# Patient Record
Sex: Female | Born: 1950 | Race: White | Hispanic: No | Marital: Married | Smoking: Never smoker
Health system: Southern US, Community
[De-identification: ages and names within clinical notes are randomized; demographics above are authoritative.]

## PROBLEM LIST (undated history)

## (undated) DIAGNOSIS — E079 Disorder of thyroid, unspecified: Secondary | ICD-10-CM

## (undated) DIAGNOSIS — H269 Unspecified cataract: Secondary | ICD-10-CM

## (undated) DIAGNOSIS — I10 Essential (primary) hypertension: Secondary | ICD-10-CM

## (undated) HISTORY — DX: Essential (primary) hypertension: I10

## (undated) HISTORY — DX: Unspecified cataract: H26.9

## (undated) HISTORY — DX: Disorder of thyroid, unspecified: E07.9

## (undated) HISTORY — PX: TUBAL LIGATION: SHX77

## (undated) HISTORY — PX: FOOT SURGERY: SHX648

---

## 2014-06-12 ENCOUNTER — Encounter: Payer: Self-pay | Admitting: Podiatry

## 2014-06-12 ENCOUNTER — Ambulatory Visit: Payer: Self-pay

## 2014-06-12 ENCOUNTER — Ambulatory Visit (INDEPENDENT_AMBULATORY_CARE_PROVIDER_SITE_OTHER): Payer: 59 | Admitting: Podiatry

## 2014-06-12 VITALS — BP 128/78 | HR 83 | Resp 16

## 2014-06-12 DIAGNOSIS — M722 Plantar fascial fibromatosis: Secondary | ICD-10-CM

## 2014-06-12 DIAGNOSIS — M76812 Anterior tibial syndrome, left leg: Secondary | ICD-10-CM | POA: Diagnosis not present

## 2014-06-12 DIAGNOSIS — M79672 Pain in left foot: Secondary | ICD-10-CM

## 2014-06-12 NOTE — Progress Notes (Signed)
   Subjective:    Patient ID: Lori LahRachel Barile, female    DOB: 05/28/1950, 64 y.o.   MRN: [id number]  HPI Comments: "I have a pain on the side"  Patient c/o aching medial foot left for several months. She has AM pain. Tried different shoes-no help.  Foot Pain Associated symptoms include arthralgias.      Review of Systems  Musculoskeletal: Positive for arthralgias.  All other systems reviewed and are negative.      Objective:   Physical Exam: I have reviewed her past medical history medications allergies surgery social history and review of systems area pulses are strongly palpable bilateral. Neurologic sensorium is intact per Semmes-Weinstein monofilament. Deep tendon reflexes are intact bilateral and muscle strength +5 over 5 dorsiflexors plantar flexors and inverters everters all intrinsic musculature is intact. 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 There is an area of bogginess over the insertion site that appears to be fluctuant but slightly ganglion or a bursitis. This very well could be a split tear with fluid collection. This could be a bursitis or a ganglion.        Assessment & Plan:  Assessment: Insertional tibialis anterior tendinitis left.  Plan: Injected the area today with Kenalog and local anesthetic. Discussed appropriate shoe gear stretching her size and ice therapy will follow-up with her in 1 month.

## 2014-07-12 ENCOUNTER — Ambulatory Visit (INDEPENDENT_AMBULATORY_CARE_PROVIDER_SITE_OTHER): Payer: 59 | Admitting: Podiatry

## 2014-07-12 ENCOUNTER — Encounter: Payer: Self-pay | Admitting: Podiatry

## 2014-07-12 DIAGNOSIS — M76812 Anterior tibial syndrome, left leg: Secondary | ICD-10-CM | POA: Diagnosis not present

## 2014-07-12 NOTE — Progress Notes (Signed)
She presents today for follow-up of her tenosynovitis and tibialis anterior tendinitis. She states that she is a proximally 90% improved.  Objective: Vital signs are stable she is alert and oriented 3. Pulses are palpable left. She has mild fluctuance at the very distal aspect of the insertion site but much decreased than previously noted. No erythema cellulitis drainage or odor.  Assessment: Very mild residual insertional tibialis anterior tendinitis left.  Plan: Injected 2 mg of dexamethasone to the deep surface of the insertion site. Follow up with her in 1 month if necessary.

## 2014-08-09 ENCOUNTER — Ambulatory Visit: Payer: 59 | Admitting: Podiatry

## 2015-01-01 ENCOUNTER — Ambulatory Visit (INDEPENDENT_AMBULATORY_CARE_PROVIDER_SITE_OTHER): Payer: 59

## 2015-01-01 ENCOUNTER — Encounter: Payer: Self-pay | Admitting: Podiatry

## 2015-01-01 ENCOUNTER — Ambulatory Visit (INDEPENDENT_AMBULATORY_CARE_PROVIDER_SITE_OTHER): Payer: 59 | Admitting: Podiatry

## 2015-01-01 VITALS — BP 114/79 | HR 64 | Resp 16

## 2015-01-01 DIAGNOSIS — M7662 Achilles tendinitis, left leg: Secondary | ICD-10-CM

## 2015-01-01 DIAGNOSIS — M76812 Anterior tibial syndrome, left leg: Secondary | ICD-10-CM | POA: Diagnosis not present

## 2015-01-01 DIAGNOSIS — M79672 Pain in left foot: Secondary | ICD-10-CM

## 2015-01-01 MED ORDER — METHYLPREDNISOLONE 4 MG PO TBPK: ORAL_TABLET | ORAL | Status: DC

## 2015-01-01 NOTE — Progress Notes (Signed)
She presents today with chief complaint of pain to the anteromedial aspect of her left foot. She also has some posterior Achilles tendon pain. She states that after the injections and medications last visit I was doing very well up until a month ago. She denies any changes in her past medical history medications allergies or surgeries.  Objective: Vital signs are stable she is alert and oriented 3. Pulses are strongly palpable left foot. She has mild tenderness on inversion against resistance with inversion and dorsiflexion is particularly at the insertion site of the tibialis anterior muscle and tendon. She also has some bursitis on the posterior aspect of the calcaneus at the calcaneal Achilles insertion site. Radiographs confirmed no major osseous abnormalities in these areas.  Assessment: Insertional Achilles tendinitis with bursitis left. Tibialis anterior tendinitis at its insertion left.  Plan: Injected these areas today subcutaneously with 2 mg of dexamethasone did not inject into the tendons. Dispensed a cam walker for her left foot and wrote a prescription for Medrol Dosepak. I encouraged her to leave this Cam Walker on for the next 2-3 weeks and I will follow-up with her in 1 month. She will ice when she has the opportunity.

## 2015-01-01 NOTE — Patient Instructions (Signed)

## 2015-02-05 ENCOUNTER — Telehealth: Payer: Self-pay | Admitting: *Deleted

## 2015-02-05 ENCOUNTER — Encounter: Payer: Self-pay | Admitting: Podiatry

## 2015-02-05 ENCOUNTER — Ambulatory Visit (INDEPENDENT_AMBULATORY_CARE_PROVIDER_SITE_OTHER): Payer: 59 | Admitting: Podiatry

## 2015-02-05 VITALS — BP 116/51 | HR 82 | Resp 16

## 2015-02-05 DIAGNOSIS — M7662 Achilles tendinitis, left leg: Secondary | ICD-10-CM

## 2015-02-05 DIAGNOSIS — M76812 Anterior tibial syndrome, left leg: Secondary | ICD-10-CM | POA: Diagnosis not present

## 2015-02-05 NOTE — Progress Notes (Signed)
She presents today for follow-up of her tibialis anterior tendinitis at its insertion site left foot. As well as the Achilles tendinitis left foot. She states that it is still quite sore and really the injection only lasted approximately 1-2 weeks.  Objective: Pulses are strongly palpable left foot. She has tenderness on palpation of the insertion site of the tibialis anterior and the Achilles tendon left.  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  Plan: Request MRI for surgical consideration.

## 2015-02-05 NOTE — Telephone Encounter (Addendum)
Faxed to [facility].  [employer] prior approval notification# [id number].  Faxed to [facility].

## 2015-02-13 ENCOUNTER — Encounter: Payer: Self-pay | Admitting: *Deleted

## 2015-02-13 ENCOUNTER — Ambulatory Visit
Admission: RE | Admit: 2015-02-13 | Discharge: 2015-02-13 | Disposition: A | Discharge status: Home / Self Care | Payer: 59 | Source: Ambulatory Visit | Attending: Podiatry | Admitting: Podiatry

## 2015-02-13 DIAGNOSIS — M7662 Achilles tendinitis, left leg: Secondary | ICD-10-CM

## 2015-02-13 DIAGNOSIS — M76812 Anterior tibial syndrome, left leg: Secondary | ICD-10-CM

## 2015-02-14 ENCOUNTER — Telehealth: Payer: Self-pay | Admitting: *Deleted

## 2015-02-14 ENCOUNTER — Ambulatory Visit: Payer: 59 | Admitting: Podiatry

## 2015-02-14 DIAGNOSIS — M7662 Achilles tendinitis, left leg: Secondary | ICD-10-CM

## 2015-02-14 DIAGNOSIS — M76812 Anterior tibial syndrome, left leg: Secondary | ICD-10-CM

## 2015-02-14 NOTE — Telephone Encounter (Addendum)
-----   Message from Elinor Parkinson, [place] sent at 02/14/2015  7:40 AM EDT ----- Milton Ferguson call [place] and let her know that her achilles tendon is good and the tibialis anterior tendon is good per MRI.  I don't need to see her today.  I would like her to start PT near where she lives and try that for one month.  I then would like for you to call the imaging center where this mri was read and ask them to specifically look at the tibialis anterior insertion and the achilles at its insertion site.  There is something going on there.  I can palpate it.  Ask them to take a closer look and get back to [place].    There is no reason for her to drive from [facility] today for me to tell her to go to PT.  Thanks   Orders to pt, she states she will call me, with a PT in network and close.  Pt called states she has an appt on 02/20/2015 [facility] just needs our orders - phone [phone] and fax [phone].. I called [facility] - Selena Batten will fax me a form.

## 2015-02-14 NOTE — Telephone Encounter (Addendum)
-----   Message from Elinor Parkinson, [place] sent at 02/13/2015  5:02 PM EDT ----- Send for an overe read and ask them to look at the area of interest. Ordered MRI disc for SE OverRead, Tamela Oddi states she will have the disc burned by the tech from the MRI machine.  I informed pt of the delay for the in-depth reading and will call with results are in.

## 2015-02-14 NOTE — Telephone Encounter (Signed)
-----   Message from Elinor Parkinson, [place] sent at 02/14/2015  7:40 AM EDT ----- Milton Ferguson call [facility] and let her know that her achilles tendon is good and the tibialis anterior tendon is good per MRI.  I don't need to see her today.  I would like her to start PT near where she lives and try that for one month.  I then would like for you to call the imaging center where this mri was read and ask them to specifically look at the tibialis anterior insertion and the achilles at its insertion site.  There is something going on there.  I can palpate it.  Ask them to take a closer look and get back to [place].    There is no reason for her to drive from [facility] today for me to tell her to go to PT.  Thanks   Orders to pt, she states she will call me, with a PT in network and close.  Pt called states she has an appt on 02/20/2015 [facility] just needs our orders - phone [phone] and fax [phone].. I called [facility] - Selena Batten will fax me a form.  I faxed our EPIC form due to non-receipt of the [facility] form to [phone].

## 2015-04-04 ENCOUNTER — Encounter: Payer: Self-pay | Admitting: Podiatry

## 2015-04-04 ENCOUNTER — Ambulatory Visit (INDEPENDENT_AMBULATORY_CARE_PROVIDER_SITE_OTHER): Payer: 59 | Admitting: Podiatry

## 2015-04-04 VITALS — BP 118/72 | HR 89 | Resp 16

## 2015-04-04 DIAGNOSIS — M7662 Achilles tendinitis, left leg: Secondary | ICD-10-CM

## 2015-04-04 DIAGNOSIS — M76812 Anterior tibial syndrome, left leg: Secondary | ICD-10-CM

## 2015-04-04 MED ORDER — DICLOFENAC SODIUM 1 % TD GEL: 4.0000 g | Freq: Four times a day (QID) | TRANSDERMAL | Status: DC

## 2015-04-04 NOTE — Progress Notes (Signed)
She presents today for her MRI results regarding her left Achilles tendon and insertional tibialis anterior tendon pain. She states that is about the same really doesn't bother her all the time.  Objective: Vital signs are stable she is alert and oriented 3. Pulses are palpable. She still has tenderness on palpation of the tibialis anterior is insertion site with fluctuance as well as the Achilles tendon. MRI states that there are split tears within the posterior tibial tendon near its insertion site as well as the perineal tendon. It also states that she has some insertional fraying of the tibialis anterior tendon. It also relates Achilles tendinitis from the muscle belly distally.  Assessment: Achilles tendinitis and tibialis anterior tendinitis.  Plan: I wrote a prescription for diclofenac gel to be applied 4 times a day to these areas as well as placed her in a short cam boot and a night splint. I will follow up with her in 4-6 weeks and consider surgical intervention if needed.

## 2015-05-16 ENCOUNTER — Ambulatory Visit: Payer: 59 | Admitting: Podiatry

## 2015-05-16 ENCOUNTER — Ambulatory Visit (INDEPENDENT_AMBULATORY_CARE_PROVIDER_SITE_OTHER): Payer: 59

## 2015-05-16 ENCOUNTER — Encounter: Payer: Self-pay | Admitting: Podiatry

## 2015-05-16 ENCOUNTER — Ambulatory Visit (INDEPENDENT_AMBULATORY_CARE_PROVIDER_SITE_OTHER): Payer: 59 | Admitting: Podiatry

## 2015-05-16 VITALS — BP 100/68 | HR 74 | Resp 16

## 2015-05-16 DIAGNOSIS — M76812 Anterior tibial syndrome, left leg: Secondary | ICD-10-CM

## 2015-05-16 DIAGNOSIS — M7662 Achilles tendinitis, left leg: Secondary | ICD-10-CM

## 2015-05-16 DIAGNOSIS — M79672 Pain in left foot: Secondary | ICD-10-CM

## 2015-05-20 NOTE — Progress Notes (Signed)
She presents today for her 4 week follow-up regarding her Achilles tendinitis and tibialis anterior tendinitis of her left foot. She states that she continues to use of diclofenac gel but she still has pain first thing in the morning as she gets out of bed. She states that the pain subsides after she's been on the foot for a period of time. She's been utilizing the short Cam Walker for the past 4-6 weeks.  Objective: Vital signs are stable she is alert and oriented 3. Pulses are strongly palpable left foot. She has minimal discomfort on palpation of the tibialis anterior at its insertion site where an MRI does show a short segment tear. She also has some insertional pain on the posterior medial aspect of the Achilles tendon.  Assessment: Achilles tendinitis and tibialis anterior tendinitis left foot. Short segment tear of the tibialis anterior left foot.  Plan: Discussed etiology pathology conservative versus surgical therapies. At this point she would like to hold off on surgery as long as possible. She would like to consider being able to walk without the boot for a period of time just to reevaluate. I will follow-up with her in 4 weeks.

## 2015-06-13 ENCOUNTER — Ambulatory Visit: Payer: 59 | Admitting: Podiatry

## 2015-06-18 ENCOUNTER — Ambulatory Visit (INDEPENDENT_AMBULATORY_CARE_PROVIDER_SITE_OTHER): Payer: 59 | Admitting: Podiatry

## 2015-06-18 ENCOUNTER — Encounter: Payer: Self-pay | Admitting: Podiatry

## 2015-06-18 VITALS — BP 113/71 | HR 81 | Resp 16

## 2015-06-18 DIAGNOSIS — M7662 Achilles tendinitis, left leg: Secondary | ICD-10-CM | POA: Diagnosis not present

## 2015-06-18 DIAGNOSIS — M76812 Anterior tibial syndrome, left leg: Secondary | ICD-10-CM

## 2015-06-18 DIAGNOSIS — M7672 Peroneal tendinitis, left leg: Secondary | ICD-10-CM

## 2015-06-18 NOTE — Patient Instructions (Signed)
Pre-Operative Instructions  Congratulations, you have decided to take an important step to improving your quality of life.  You can be assured that the doctors of [facility] will be with you every step of the way.  1. Plan to be at the surgery center/hospital at least 1 (one) hour prior to your scheduled time unless otherwise directed by the surgical center/hospital staff.  You must have a responsible adult accompany you, remain during the surgery and drive you home.  Make sure you have directions to the surgical center/hospital and know how to get there on time. 2. For hospital based surgery you will need to obtain a history and physical form from your family physician within 1 month prior to the date of surgery- we will give you a form for you primary physician.  3. We make every effort to accommodate the date you request for surgery.  There are however, times where surgery dates or times have to be moved.  We will contact you as soon as possible if a change in schedule is required.   4. No Aspirin/Ibuprofen for one week before surgery.  If you are on aspirin, any non-steroidal anti-inflammatory medications (Mobic, Aleve, Ibuprofen) you should stop taking it 7 days prior to your surgery.  You make take Tylenol  For pain prior to surgery.  5. Medications- If you are taking daily heart and blood pressure medications, seizure, reflux, allergy, asthma, anxiety, pain or diabetes medications, make sure the surgery center/hospital is aware before the day of surgery so they may notify you which medications to take or avoid the day of surgery. 6. No food or drink after midnight the night before surgery unless directed otherwise by surgical center/hospital staff. 7. No alcoholic beverages 24 hours prior to surgery.  No smoking 24 hours prior to or 24 hours after surgery. 8. Wear loose pants or shorts- loose enough to fit over bandages, boots, and casts. 9. No slip on shoes, sneakers are best. 10. Bring  your boot with you to the surgery center/hospital.  Also bring crutches or a walker if your physician has prescribed it for you.  If you do not have this equipment, it will be provided for you after surgery. 11. If you have not been contracted by the surgery center/hospital by the day before your surgery, call to confirm the date and time of your surgery. 12. Leave-time from work may vary depending on the type of surgery you have.  Appropriate arrangements should be made prior to surgery with your employer. 13. Prescriptions will be provided immediately following surgery by your doctor.  Have these filled as soon as possible after surgery and take the medication as directed. 14. Remove nail polish on the operative foot. 15. Wash the night before surgery.  The night before surgery wash the foot and leg well with the antibacterial soap provided and water paying special attention to beneath the toenails and in between the toes.  Rinse thoroughly with water and dry well with a towel.  Perform this wash unless told not to do so by your physician.  Enclosed: 1 Ice pack (please put in freezer the night before surgery)   1 Hibiclens skin cleaner   Pre-op Instructions  If you have any questions regarding the instructions, do not hesitate to call our office.  [place]: [address] [place], [place] [address] [phone]  [place]: [address] [place], [place] [address] [phone]  [place]: [address]  [place], [place] [address] [phone]  Dr. Richard   Tuchman DPM, Dr. Norman Regal DPM Dr. Richard Sikora DPM, Dr. M. Todd Hyatt DPM, Dr. Kathryn Egerton DPM 

## 2015-06-18 NOTE — Progress Notes (Signed)
She presents today with her husband for surgical consult regarding her left foot. She states that her left foot is still painful though it is better than when we started is still significantly intolerable for her daily activities. She is requesting surgical intervention at this time. She denies changes in her past medical history medications allergies surgery social history. She has tried and failed all conservative therapies including physical therapy injection therapy steroids nonsteroidals and immobilization for several months.  Objective: Vital signs are stable alert and oriented 3. Pulses are strongly palpable left. No significant changes on physical examination. Still has considerable pain at the tibialis anterior insertion site. Significant pain on palpation of the Achilles tendon with significant gastroc equinus left foot and ankle. 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 He also confirms a split tear of the peroneal brevis tendon.  Assessment: Insertional Achilles tendinitis insertional tibialis anterior tendinitis, split tear of the peroneal brevis tendon. Gastroc equinus.  Plan: Discussed etiology pathology conservative versus surgical therapies. We went over a consent form today line by line number by number given her ample time to ask questions she saw fit regarding a gastroc recession, and Achilles tendon lysis, retrocalcaneal heel spur resection,. Peroneal tendon repair, repair tibialis anterior tendon insertion on the left foot with cast application. I answered all the questions regarding these procedures to the best of my ability in layman's terms. We discussed the possible postoperative palpitations which may include but are not limited to postop pain bleeding swelling infection recurrence need for further surgery and for physical therapy. Loss of digit loss of limb loss of life. I will follow-up with her in March for surgery.

## 2015-06-19 ENCOUNTER — Telehealth: Payer: Self-pay | Admitting: *Deleted

## 2015-06-19 NOTE — Telephone Encounter (Signed)
"  Please give me a call back.  I was told I needed to get a history and physical form from my primary care physician.  You didn't give me the form in the packet that you gave me.  I'm getting ready to schedule that appointment with my doctor.  Let me know how I can get that form.  Thank you."  I'm returning your call.  Did Dr. Al Corpus tell you to have a physical?  "No, he did not tell me to have a physical but that's what was in my paperwork that you gave me."  You do not have to have a physical.  That is only if you have surgery at the hospital.  You're not having surgery at the hospital.  You're having surgery at [facility].  It is not required there.  "Oh okay, I was scheduled to go anyway so I was going to reschedule it if I needed to wait on the form to get to me.  Thank you so much."

## 2015-07-11 ENCOUNTER — Telehealth: Payer: Self-pay | Admitting: *Deleted

## 2015-07-11 NOTE — Telephone Encounter (Signed)
I faxed authorization for surgery scheduled for 07/26/2015 to [place] [facility].  Authorization number is [id number].

## 2015-07-25 ENCOUNTER — Other Ambulatory Visit: Payer: Self-pay | Admitting: Podiatry

## 2015-07-25 MED ORDER — ONDANSETRON HCL 4 MG PO TABS: 4.0000 mg | ORAL_TABLET | Freq: Three times a day (TID) | ORAL | Status: AC | PRN

## 2015-07-25 MED ORDER — HYDROMORPHONE HCL 4 MG PO TABS: ORAL_TABLET | ORAL | Status: AC

## 2015-07-25 MED ORDER — CEPHALEXIN 500 MG PO CAPS: 500.0000 mg | ORAL_CAPSULE | Freq: Three times a day (TID) | ORAL | Status: DC

## 2015-07-26 DIAGNOSIS — M216X2 Other acquired deformities of left foot: Secondary | ICD-10-CM | POA: Diagnosis not present

## 2015-07-26 DIAGNOSIS — M2042 Other hammer toe(s) (acquired), left foot: Secondary | ICD-10-CM

## 2015-07-26 DIAGNOSIS — M76812 Anterior tibial syndrome, left leg: Secondary | ICD-10-CM | POA: Diagnosis not present

## 2015-07-26 DIAGNOSIS — M7732 Calcaneal spur, left foot: Secondary | ICD-10-CM

## 2015-07-27 ENCOUNTER — Telehealth: Payer: Self-pay

## 2015-07-27 NOTE — Telephone Encounter (Signed)
Spoke with pt husband, pt was resting at the time. He stated she was feeling well, that the block had worn off at 3am this morning and she had been taking prn medication as scheduled. He stated she has been resting comfortably, and managing pain well. Denied fever, chill, nausea. She was icing and elvating her foot and resting. Advised to call if any questions come up or if any acute symptoms occur.

## 2015-08-01 ENCOUNTER — Ambulatory Visit (INDEPENDENT_AMBULATORY_CARE_PROVIDER_SITE_OTHER): Payer: 59

## 2015-08-01 ENCOUNTER — Encounter: Payer: Self-pay | Admitting: Podiatry

## 2015-08-01 ENCOUNTER — Ambulatory Visit (INDEPENDENT_AMBULATORY_CARE_PROVIDER_SITE_OTHER): Payer: 59 | Admitting: Podiatry

## 2015-08-01 VITALS — BP 99/59 | HR 105 | Temp 98.1°F | Resp 16

## 2015-08-01 DIAGNOSIS — Z9889 Other specified postprocedural states: Secondary | ICD-10-CM | POA: Diagnosis not present

## 2015-08-01 DIAGNOSIS — M7672 Peroneal tendinitis, left leg: Secondary | ICD-10-CM | POA: Diagnosis not present

## 2015-08-01 DIAGNOSIS — M76812 Anterior tibial syndrome, left leg: Secondary | ICD-10-CM | POA: Diagnosis not present

## 2015-08-01 DIAGNOSIS — M7662 Achilles tendinitis, left leg: Secondary | ICD-10-CM

## 2015-08-04 NOTE — Progress Notes (Signed)
She presents today 1 week status post struck recession, Achilles tendon lysis with resection of retrocalcaneal heel spur. Repair of her tibialis anterior tendon. She denies fever chills nausea vomiting muscle aches and pains. No shortness of breath or chest pain. No calf pain. She states that really she has not had a whole lot of pain at all.  Objective: Vital signs are stable alert and oriented 3. Pulses are palpable. She has great range of motion of her toes the cast is not tight with vital signs stable on the leave the cast on for 1 more week.  Assessment: Well-healing surgical foot and leg left.  Plan: Continue nonweightbearing status left foot we will follow up with her in 1 more week at which time the cast will be removed and replaced.

## 2015-08-08 ENCOUNTER — Encounter: Payer: Self-pay | Admitting: Podiatry

## 2015-08-08 ENCOUNTER — Ambulatory Visit (INDEPENDENT_AMBULATORY_CARE_PROVIDER_SITE_OTHER): Payer: 59 | Admitting: Podiatry

## 2015-08-08 VITALS — BP 113/70 | HR 86 | Resp 12

## 2015-08-08 DIAGNOSIS — M76812 Anterior tibial syndrome, left leg: Secondary | ICD-10-CM

## 2015-08-08 DIAGNOSIS — M7672 Peroneal tendinitis, left leg: Secondary | ICD-10-CM

## 2015-08-08 DIAGNOSIS — M7662 Achilles tendinitis, left leg: Secondary | ICD-10-CM

## 2015-08-08 DIAGNOSIS — Z9889 Other specified postprocedural states: Secondary | ICD-10-CM

## 2015-08-08 NOTE — Progress Notes (Signed)
She presents today 2 weeks status post gastroc recession Achilles tendon or lysis retrocalcaneal heel spur resection evaluation of the peroneal tendons and repair of the tibialis anterior tendon. She denies fever chills nausea vomiting muscle aches and pains she denies any calf pain left. She denies any chest pain or shortness of breath.  Objective: Vital signs are stable alert and oriented 3. Once the cast was removed and a dry sterile dressing was removed he did demonstrate some dry blood to the posterior aspect of the incision site. This seems to be doing pretty well for her at this time sutures were intact as well as were staples I did remove the majority of the staples to the gastroc recession as well as some of them to the posterior aspect of the heel and I removed them overlying the peroneal tendon. All of her incision sites. The healing very nicely SE no signs of infection. At this point I redressed her today with Betadine dressing dry sterile compressive dressing as well as application of a new below knee cast. I will follow-up with her in 2 weeks for cast removal she is to remain nonweightbearing and we will remove the remainder of the stitches and staples.

## 2015-08-22 ENCOUNTER — Ambulatory Visit (INDEPENDENT_AMBULATORY_CARE_PROVIDER_SITE_OTHER): Payer: 59

## 2015-08-22 ENCOUNTER — Ambulatory Visit (INDEPENDENT_AMBULATORY_CARE_PROVIDER_SITE_OTHER): Payer: 59 | Admitting: Podiatry

## 2015-08-22 ENCOUNTER — Encounter: Payer: Self-pay | Admitting: Podiatry

## 2015-08-22 VITALS — BP 108/64 | HR 92 | Resp 16

## 2015-08-22 DIAGNOSIS — M76812 Anterior tibial syndrome, left leg: Secondary | ICD-10-CM

## 2015-08-22 DIAGNOSIS — Z9889 Other specified postprocedural states: Secondary | ICD-10-CM | POA: Diagnosis not present

## 2015-08-22 DIAGNOSIS — M7672 Peroneal tendinitis, left leg: Secondary | ICD-10-CM | POA: Diagnosis not present

## 2015-08-22 DIAGNOSIS — M7662 Achilles tendinitis, left leg: Secondary | ICD-10-CM

## 2015-08-24 NOTE — Progress Notes (Signed)
She presents today for follow-up of her surgical heel left, gastroc recession left peroneal repair left and tibialis anterior tendon repair left. She states that she is doing very well as she presents in her cast today.  Objective: Vital signs are stable alert and oriented 3 cast intact once removed demonstrates all the remaining staples were intact margins are well coapted we removed the staples today. No erythema or edema saline as drainage or odor. She has great range of motion without any pain. No calf pain. The leg is not swollen pulses remain palpable.  Assessment: Well-healing surgical foot left.  Plan: Discussed etiology pathology conservative versus surgical therapies. Follow up with her in 3 weeks.

## 2015-09-17 ENCOUNTER — Encounter: Payer: 59 | Admitting: Podiatry

## 2015-09-19 ENCOUNTER — Ambulatory Visit (INDEPENDENT_AMBULATORY_CARE_PROVIDER_SITE_OTHER): Payer: 59

## 2015-09-19 ENCOUNTER — Ambulatory Visit (INDEPENDENT_AMBULATORY_CARE_PROVIDER_SITE_OTHER): Payer: 59 | Admitting: Podiatry

## 2015-09-19 ENCOUNTER — Encounter: Payer: Self-pay | Admitting: Podiatry

## 2015-09-19 VITALS — BP 118/68 | HR 92 | Resp 12

## 2015-09-19 DIAGNOSIS — M7672 Peroneal tendinitis, left leg: Secondary | ICD-10-CM

## 2015-09-19 DIAGNOSIS — M7662 Achilles tendinitis, left leg: Secondary | ICD-10-CM | POA: Diagnosis not present

## 2015-09-19 DIAGNOSIS — M76812 Anterior tibial syndrome, left leg: Secondary | ICD-10-CM

## 2015-09-19 DIAGNOSIS — Z9889 Other specified postprocedural states: Secondary | ICD-10-CM

## 2015-09-19 NOTE — Progress Notes (Signed)
She presents today for follow-up of her left foot surgery status post gastroc recession, retrocalcaneal heel spur resection, Achilles tendon repair, peroneal tendon evaluation and a tibialis anterior tendon repair date of surgery 07/26/2015. She states that she is doing great very little pain and she is ready to get out of her Cam Walker.  Objective: Vital signs are stable she is alert and oriented 3. There is no erythematous cellulitis drainage or odor no signs of infection. She has completely healed wounds no open lesions. She has full strength of her tibialis anterior and her Achilles. Peroneal tendon appears to be intact good full abduction against resistance without pain.  Assessment: Well-healing surgical foot and leg left.  Plan: I encouraged her to get into a good supportive shoe and to continue to wear the Cam Walker as needed. I will follow-up with her in about 6 weeks.

## 2015-10-31 ENCOUNTER — Encounter: Payer: Self-pay | Admitting: Podiatry

## 2015-10-31 ENCOUNTER — Ambulatory Visit (INDEPENDENT_AMBULATORY_CARE_PROVIDER_SITE_OTHER): Payer: 59 | Admitting: Podiatry

## 2015-10-31 ENCOUNTER — Ambulatory Visit (INDEPENDENT_AMBULATORY_CARE_PROVIDER_SITE_OTHER): Payer: 59

## 2015-10-31 DIAGNOSIS — M7672 Peroneal tendinitis, left leg: Secondary | ICD-10-CM

## 2015-10-31 DIAGNOSIS — M76812 Anterior tibial syndrome, left leg: Secondary | ICD-10-CM | POA: Diagnosis not present

## 2015-10-31 DIAGNOSIS — B070 Plantar wart: Secondary | ICD-10-CM | POA: Diagnosis not present

## 2015-10-31 DIAGNOSIS — B079 Viral wart, unspecified: Secondary | ICD-10-CM

## 2015-10-31 DIAGNOSIS — M7662 Achilles tendinitis, left leg: Secondary | ICD-10-CM

## 2015-10-31 DIAGNOSIS — Z9889 Other specified postprocedural states: Secondary | ICD-10-CM

## 2015-10-31 NOTE — Progress Notes (Signed)
She presents today for follow-up of her surgery to her Achilles tendon or gastroc recession for tibialis anterior date of surgery 07/26/2015. She also states that she is having heel pain. She states that her surgery has not resolved all of the pain in the front or in the posterior aspect of the foot she states that she was hoping that it would be much better by now.  Objective: Vital signs are stable she is alert and oriented 3. Pulses are palpable. She has minimal tenderness on palpation of the surgical sites. She has good strong plantar flexion with no pain on palpation of the Achilles. She does have 2 small verrucoid lesions to the plantar medial aspect of the left heel.  Assessment: Well-healing surgical foot. Verruca plantaris left foot 2.  Plan: Chemical destruction of the lesions today and I will follow-up with her in 6 weeks.

## 2015-12-12 ENCOUNTER — Encounter: Payer: Self-pay | Admitting: Podiatry

## 2015-12-12 ENCOUNTER — Ambulatory Visit (INDEPENDENT_AMBULATORY_CARE_PROVIDER_SITE_OTHER): Payer: 59 | Admitting: Podiatry

## 2015-12-12 DIAGNOSIS — B079 Viral wart, unspecified: Secondary | ICD-10-CM | POA: Diagnosis not present

## 2015-12-12 DIAGNOSIS — M7662 Achilles tendinitis, left leg: Secondary | ICD-10-CM

## 2015-12-12 MED ORDER — DICLOFENAC SODIUM 1 % TD GEL: 4.0000 g | Freq: Four times a day (QID) | TRANSDERMAL | Status: AC

## 2015-12-12 MED ORDER — FLUOROURACIL 5 % EX CREA: TOPICAL_CREAM | Freq: Two times a day (BID) | CUTANEOUS | Status: AC

## 2015-12-14 NOTE — Progress Notes (Signed)
She presents today for follow-up of her Achilles tendon repair her tibialis anterior tendon repair in her lateral peroneal repair she is also concerned about warts to the plantar aspect of her left foot that she does not want to have anything done to them today.  Objective: Vital signs are stable she is alert and oriented 3 Achilles surgery is healing but is still tender on palpation. She has full strength range of motion on dorsiflexion and plantarflexion and eversion. Cutaneous evaluation does demonstrate a growing wart to the plantar medial aspect of the left heel with satellite lesions.  Assessment: Verruca plantaris left foot. Slowly healing Achilles.  Plan: I recommended chemical destruction today but she declined I wrote a prescription for exudate cream to be applied. This should be applied twice daily under occlusion. I will follow-up with her in 6 weeks for reevaluation may need to consider laser therapy.

## 2016-01-17 ENCOUNTER — Encounter: Payer: Self-pay | Admitting: *Deleted

## 2016-01-17 NOTE — Progress Notes (Signed)
   DOS 07-26-15  Peroneus tendon repair left, achilles tenolysis left, gastroc ression, tibialis anterior repair,heel spur resection left

## 2016-01-23 ENCOUNTER — Ambulatory Visit (INDEPENDENT_AMBULATORY_CARE_PROVIDER_SITE_OTHER): Payer: 59 | Admitting: Podiatry

## 2016-01-23 ENCOUNTER — Encounter: Payer: Self-pay | Admitting: Podiatry

## 2016-01-23 DIAGNOSIS — B079 Viral wart, unspecified: Secondary | ICD-10-CM | POA: Diagnosis not present

## 2016-01-23 DIAGNOSIS — M76812 Anterior tibial syndrome, left leg: Secondary | ICD-10-CM

## 2016-01-23 NOTE — Progress Notes (Signed)
She presents today with a chief complaint of pain to the tibialis anterior tendon of the right foot. And a follow-up of a wart to the plantar aspect of the left heel. She states that the right foot just started hurting a little more than a week ago.  Objective: Vital signs are stable she's alert and oriented 3. Pulses are palpable. No change in the warty lesion plantar aspect of the left heel. I debrided to bleeding today. I also was able to palpate fluid within the tibialis anterior tendon sheath.  Assessment: Tibialis anterior tendinitis and wart plantar aspect left.  Plan: Chemical destruction of lesion. I injected the insertion point of the tibialis anterior tendon to the bone muscle tenderness with dexamethasone and local anesthetic. I will follow-up with her in November.

## 2016-04-23 ENCOUNTER — Encounter: Payer: Self-pay | Admitting: Podiatry

## 2016-04-23 ENCOUNTER — Ambulatory Visit (INDEPENDENT_AMBULATORY_CARE_PROVIDER_SITE_OTHER): Payer: Medicare PPO | Admitting: Podiatry

## 2016-04-23 DIAGNOSIS — B070 Plantar wart: Secondary | ICD-10-CM

## 2016-04-24 NOTE — Progress Notes (Signed)
She presents today for follow-up of plantar wart to the left heel. She states that she was using the medication which I have prescribed but it only seemed to make the work med and it started to grow. Her primary care provider had recommended that she place salicylic acid on the lesion and cover with duct tape.  Objective: Duct tape was removed demonstrates a quarter-sized lesion plantar medial heel with thrombosed capillaries.  Assessment: Verruca plantaris left.  Plan: Continue current therapies. Follow up with me as needed.

## 2016-07-26 IMAGING — MR MR FOOT*L* W/O CM
3 of 5 series · 9 of 40 positions shown · non-contrast
Comparison: None.

CLINICAL DATA: Achilles tendinitis, left medial pain

EXAM:
MRI OF THE LEFT FOREFOOT WITHOUT CONTRAST
TECHNIQUE: Multiplanar, multisequence MR imaging of the ankle was performed. No
intravenous contrast was administered.

[Series 3: PD fat-sat · axial · 3.0mm · 0.20mm/px · z∈[-67,+6]mm · 3 of 27 slices shown]
[im 4/27]
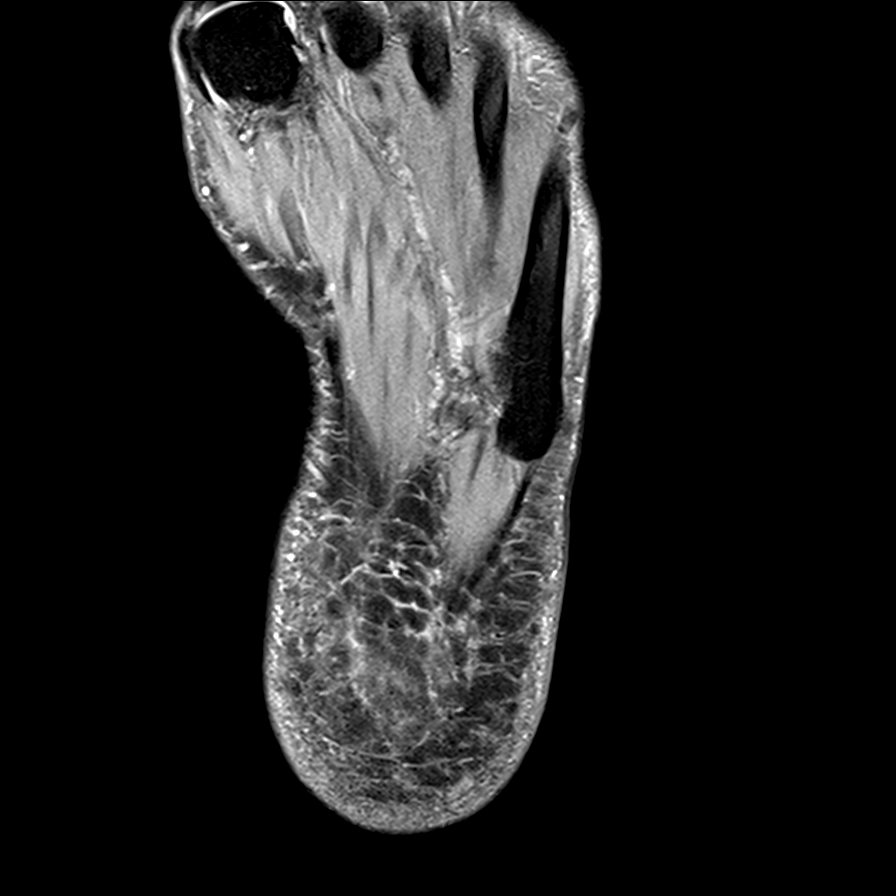
[im 15/27]
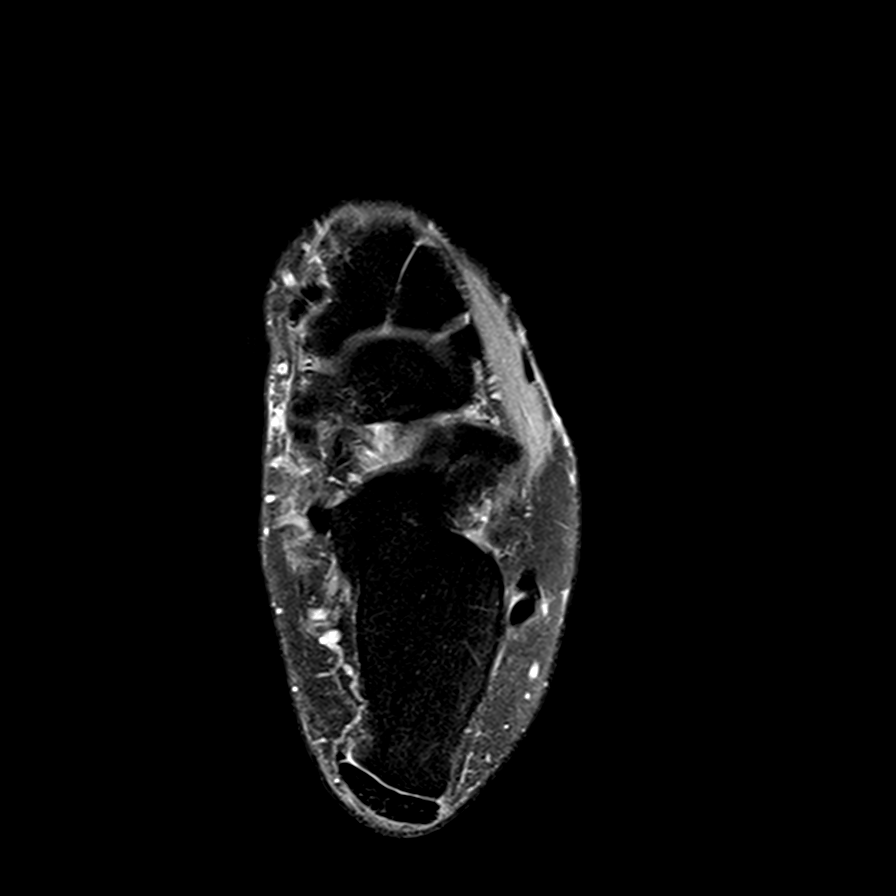
[im 23/27]
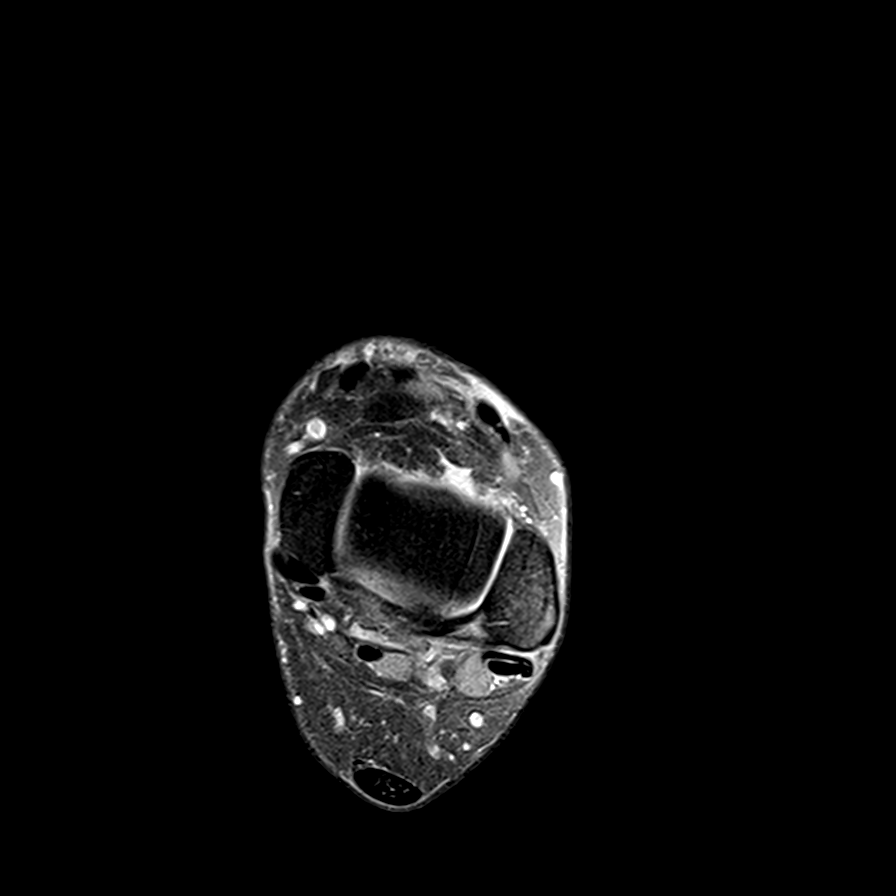

[Series 4: T2 fat-sat · axial · 3.0mm · 0.20mm/px · z∈[-67,+6]mm · 3 of 27 slices shown (1 of 2)]
[im 4/27]
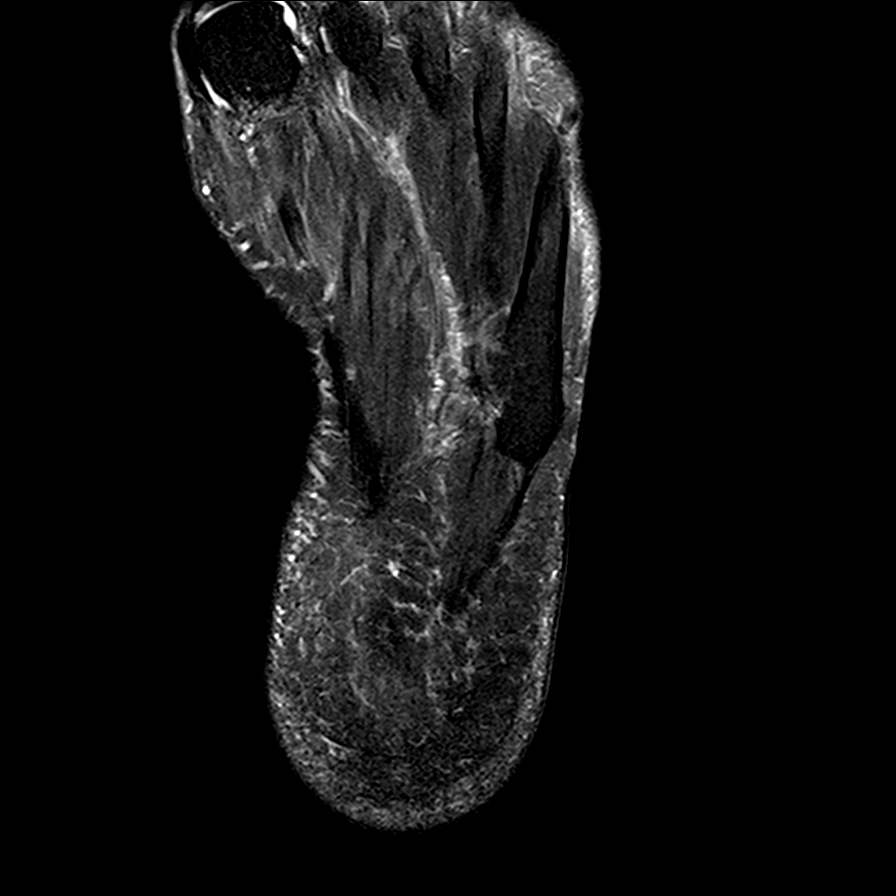
[im 15/27]
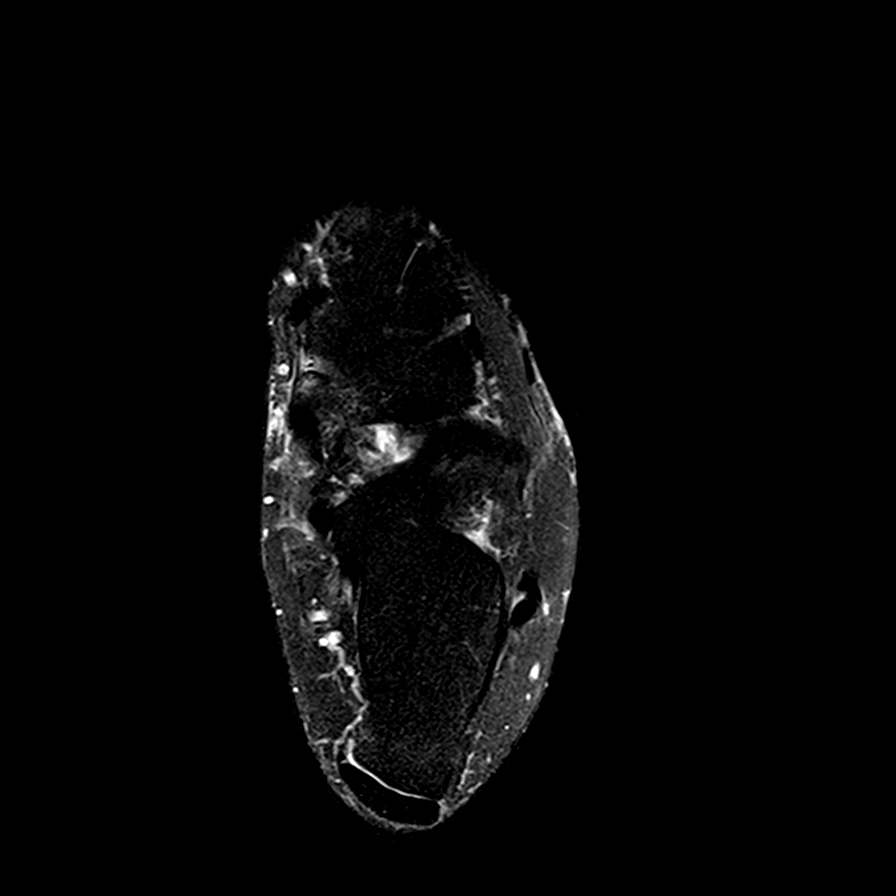
[im 23/27]
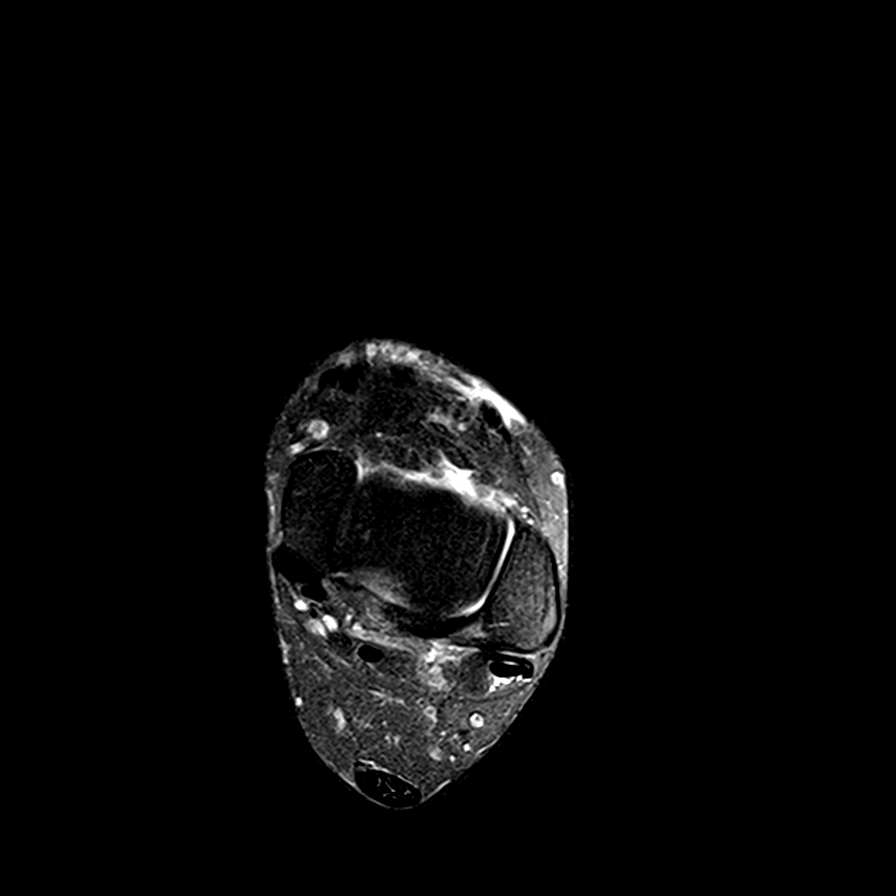

[Series 6: T2 fat-sat · coronal · 3.0mm · 0.33mm/px · 3 of 35 slices shown (2 of 2)]
[im 4/35]
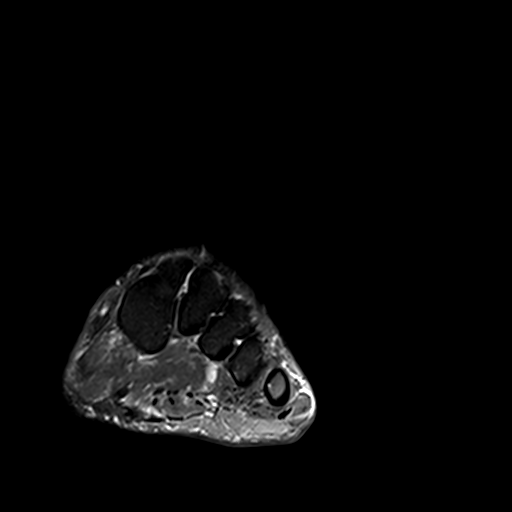
[im 19/35]
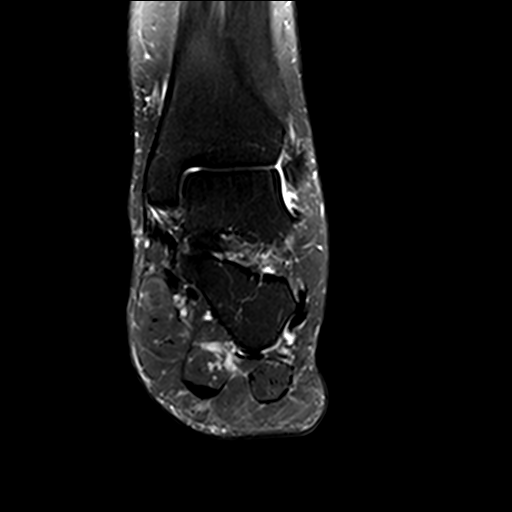
[im 31/35]
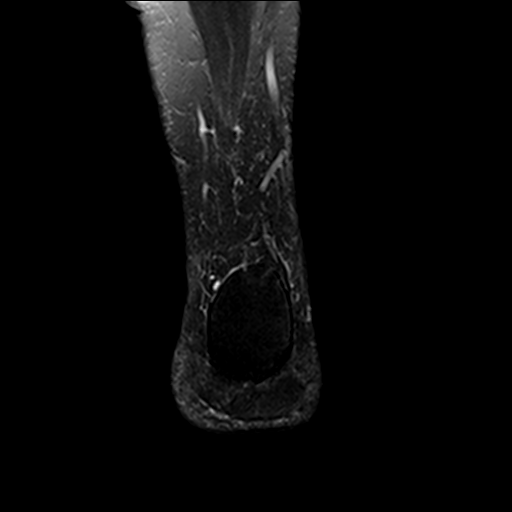

[9 of 40 positions shown; findings below may reference images not displayed]

FINDINGS: TENDONS

Peroneal: Intact peroneus longus. Short-segment longitudinal split
tear of the peroneus brevis at the level of the lateral malleolus.

Posteromedial: Mild tendinosis of the tibialis posterior tendon with
a small partial tear just distal to the medial malleolus. Intact
flexor hallucis longus and flexor digitorum longus tendons.

Anterior: Intact.

Achilles: Intact.

Plantar Fascia: Intact.

LIGAMENTS

Lateral: Intact.

Medial: Intact.

CARTILAGE

Ankle Joint: Normal ankle mortise. No joint effusion. No chondral
defect.

Subtalar Joints/Sinus Tarsi: Normal subtalar joints. Normal sinus
tarsi.

Bones: No marrow signal abnormality.  No fracture or dislocation.
IMPRESSION: 1. Mild tendinosis of the tibialis posterior tendon with a small
partial tear just distal to the medial malleolus.
2. Short-segment longitudinal split tear of the peroneus brevis at
the level of the lateral malleolus.

## 2017-09-30 NOTE — Progress Notes (Addendum)
[facility] Note  10/01/2017     CHIEF COMPLAINT Patient presents for Retina Evaluation   HISTORY OF PRESENT ILLNESS: Lori Schaefer is a 67 y.o. female who presents to the clinic today for:   HPI    Retina Evaluation    In both eyes.  This started 1 month ago.  Associated Symptoms Floaters.  Negative for Blind Spot, Glare, Shoulder/Hip pain, Fatigue, Jaw Claudication, Photophobia, Distortion, Redness, Scalp Tenderness, Weight Loss, Fever, Trauma, Pain and Flashes.  Context:  distance vision, mid-range vision and near vision.  Treatments tried include no treatments.  I, the attending physician,  performed the HPI with the patient and updated documentation appropriately.          Comments    Referral of DR. Hecker for retina evaluation. Patient states years ago she saw a ophthalmologist, she was told she had something in the back of her right eye not sure what was seen. Pt reports she is to have cataract sx OU  with Dr. Herbert Deaner soon. Pt states she has occasional floaters OD, denies wavy vision and ocular pain. Denies eye gtt's. She is taking multivitamins qd.       Last edited by [name], MD on 10/01/2017 10:56 AM. (History)    Pt states she had a self healing RD OD; Pt states she went to Dr. Herbert Deaner for cataract evaluation on the referral of Dr. Radford Pax (her regular eye doctor); Pt states she has blurred VA due to cataracts OU;   Referring physician: [name], MD [place], [place] [address]  HISTORICAL INFORMATION:   Selected notes from the MEDICAL RECORD NUMBER Referred by Dr. [name] for surgical clearance OD LEE: 05.02.19 (K. Hecker) [BCVA: OD: 20/30-2 OS: 20/25-1] Ocular Hx-Cataract OU, HTN Ret, Hx of RD (self-repaired), ERM OD, Salzmann's nodular dystrophy OD, drusen OS PMH-HTN,     CURRENT MEDICATIONS: No current outpatient medications on file. (Ophthalmic Drugs)   No current facility-administered medications  for this visit.  (Ophthalmic Drugs)   Current Outpatient Medications (Other)  Medication Sig  . calcium carbonate (OSCAL) 1500 (600 Ca) MG TABS tablet Take by mouth 2 (two) times daily with a meal.  . CHLORTHALIDONE PO Take by mouth.  . diclofenac sodium (VOLTAREN) 1 % GEL Apply 4 g topically 4 (four) times daily.  . fluorouracil (EFUDEX) 5 % cream Apply topically 2 (two) times daily.  Marland Kitchen levothyroxine (SYNTHROID, LEVOTHROID) 75 MCG tablet Take 75 mcg by mouth daily before breakfast.  . Multiple Vitamin (MULTIVITAMIN) capsule Take 1 capsule by mouth daily.  Marland Kitchen telmisartan (MICARDIS) 40 MG tablet Take 40 mg by mouth daily.  Marland Kitchen HYDROmorphone (DILAUDID) 4 MG tablet Take one tablet by mouth every six to eight hours as needed for pain. (Patient not taking: Reported on 10/01/2017)  . irbesartan (AVAPRO) 300 MG tablet Take 300 mg by mouth daily.  . ondansetron (ZOFRAN) 4 MG tablet Take 1 tablet (4 mg total) by mouth every 8 (eight) hours as needed for nausea or vomiting. (Patient not taking: Reported on 10/01/2017)   No current facility-administered medications for this visit.  (Other)      REVIEW OF SYSTEMS: ROS    Positive for: Eyes   Negative for: Constitutional, Gastrointestinal, Neurological, Skin, Genitourinary, Musculoskeletal, HENT, Endocrine, Cardiovascular, Respiratory, Psychiatric, Allergic/Imm, Heme/Lymph   Last edited by Zenovia Jordan, LPN on 09/16/9561  8:75 AM. (History)       ALLERGIES No Known Allergies  PAST MEDICAL HISTORY Past  Medical History:  Diagnosis Date  . Cataract   . Hypertension   . Thyroid disease    Past Surgical History:  Procedure Laterality Date  . FOOT SURGERY    . TUBAL LIGATION      FAMILY HISTORY Family History  Problem Relation Age of Onset  . Heart failure Mother     SOCIAL HISTORY Social History   Tobacco Use  . Smoking status: Never Smoker  . Smokeless tobacco: Never Used  Substance Use Topics  . Alcohol use: Yes     Alcohol/week: 0.6 oz    Types: 1 Glasses of wine per week  . Drug use: Never         OPHTHALMIC EXAM:  Base Eye Exam    Visual Acuity (Snellen - Linear)      Right Left   Dist cc 20/30 20/25   Dist ph cc NI NI   Correction:  Glasses       Tonometry (Tonopen, 9:32 AM)      Right Left   Pressure 13 14       Pupils      Dark Light Shape React APD   Right 4 2 Round Brisk None   Left 4 2 Round Brisk None       Visual Fields (Counting fingers)      Left Right    Full Full       Extraocular Movement      Right Left    Full, Ortho Full, Ortho       Neuro/Psych    Oriented x3:  Yes   Mood/Affect:  Normal       Dilation    Both eyes:  1.0% Mydriacyl, 2.5% Phenylephrine @ 9:32 AM        Slit Lamp and Fundus Exam    Slit Lamp Exam      Right Left   Lids/Lashes Dermatochalasis - upper lid Dermatochalasis - upper lid   Conjunctiva/Sclera White and quiet White and quiet   Cornea Arcus, 2+ central Punctate epithelial erosions, salzmann nodualt superiorly Arcus, 2+ central Punctate epithelial erosions, salzmann nodualt superiorly   Anterior Chamber Deep and quiet Deep and quiet   Iris Round and dilated, PPM Round and dilated   Lens 2+ Nuclear sclerosis, 2+ Cortical cataract, 1+ Posterior subcapsular cataract 2+ Nuclear sclerosis, 2+ Cortical cataract, 1+ Posterior subcapsular cataract   Vitreous Vitreous syneresis Vitreous syneresis       Fundus Exam      Right Left   Disc Pink and Sharp Mild tilt, mild Peripapillary atrophy   C/D Ratio 0.3 0.3   Macula Atrophy and pigment clumping inferior and temporal macula, Flat, Retinal pigment epithelial mottling, No heme or edema Flat, Retinal pigment epithelial mottling, No heme or edema   Vessels Vascular attenuation -- greatest inferiorly, mildly Tortuous Mild Vascular attenuation   Periphery Attached, extensive atrophy and pigment clumping from 0400 to 0130 consistent with history of old RD - sparing the fovea Attached         Refraction    Manifest Refraction      Sphere Cylinder Axis Dist VA   Right -3.75 +2.00 005    Left -5.00 +1.75 175        Manifest Refraction #2      Sphere Cylinder Axis Dist VA   Right -3.75 +2.00 015 20/30+2   Left -5.50 +2.00 175 20/25          IMAGING AND PROCEDURES  Imaging and Procedures for @TODAY @  OCT, Retina - OU - Both Eyes       Right Eye Quality was good. Central Foveal Thickness: 355. Progression has no prior data. Findings include normal foveal contour, no IRF, no SRF, outer retinal atrophy, inner retinal atrophy, epiretinal membrane (Mild ERM, inner and outer retinal atrophy inferiorly).   Left Eye Quality was good. Central Foveal Thickness: 302. Progression has no prior data. Findings include normal foveal contour, no IRF, no SRF (Trace ERM).   Notes *Images captured and stored on drive  Diagnosis / Impression:  OD: inferior inner and outer retinal atrophy consistent with Hx of inferior RD, mild ERM OS: NFP, No IRF/SRF, trace ERM  Clinical management:  See below  Abbreviations: NFP - Normal foveal profile. CME - cystoid macular edema. PED - pigment epithelial detachment. IRF - intraretinal fluid. SRF - subretinal fluid. EZ - ellipsoid zone. ERM - epiretinal membrane. ORA - outer retinal atrophy. ORT - outer retinal tubulation. SRHM - subretinal hyper-reflective material                  ASSESSMENT/PLAN:    ICD-10-CM   1. History of retinal detachment Z86.69   2. Epiretinal membrane (ERM) of left eye H35.372   3. Retinal edema H35.81 OCT, Retina - OU - Both Eyes  4. Essential hypertension I10   5. Hypertensive retinopathy of both eyes H35.033   6. Combined forms of age-related cataract of both eyes H25.813     1. History or RD OD - self-limited inferior and temporal RD now scarred,  - scarring affecting temporal and inferior macular, but spares fovea - appears stable and BCVA remains 20/30 - monitor  2. ERM OS-  The  natural history, anatomy, potential for loss of vision, and treatment options including vitrectomy techniques and the complications of endophthalmitis, retinal detachment, vitreous hemorrhage, cataract progression and permanent vision loss discussed with the patient. - very mild ERM OS, no significant ERM OD - F/U 6 months  3. No retinal edema on exam or OCT  4,5. Hypertensive retinopathy OU - discussed importance of tight BP control - monitor  6. Combined form age related cataract OU-  - The symptoms of cataract, surgical options, and treatments and risks were discussed with patient. - discussed diagnosis and progression - likely visually significant - under the expert care of Dr. Herbert Deaner - able to proceed with cataract surgery from retina standpoint (July 9th, OD)  Ophthalmic Meds Ordered this visit:  No orders of the defined types were placed in this encounter.      Return in about 6 months (around 04/03/2018) for F/u ERM OU, DFE, OCT.  There are no Patient Instructions on file for this visit.   Explained the diagnoses, plan, and follow up with the patient and they expressed understanding.  Patient expressed understanding of the importance of proper follow up care.   This document serves as a record of services personally performed by Gardiner Sleeper, MD, PhD. It was created on their behalf by Ernest Mallick, OA, an ophthalmic assistant. The creation of this record is the provider's dictation and/or activities during the visit.    Electronically signed by: Ernest Mallick, OA  09/30/2017 1:15 PM   This document serves as a record of services personally performed by Gardiner Sleeper, MD, PhD. It was created on their behalf by Catha Brow, [place], a certified ophthalmic assistant. The creation of this record is the provider's dictation and/or activities during the visit.  Electronically signed by: Catha Brow, [place]  05.10.19 1:15 PM   Gardiner Sleeper, M.D., Ph.D. Diseases &  Surgery of the Retina and [place] 05.10.19  I have reviewed the above documentation for accuracy and completeness, and I agree with the above. Gardiner Sleeper, M.D., Ph.D. 10/04/17 1:15 PM     Abbreviations: M myopia (nearsighted); A astigmatism; H hyperopia (farsighted); P presbyopia; Mrx spectacle prescription;  CTL contact lenses; OD right eye; OS left eye; OU both eyes  XT exotropia; ET esotropia; PEK punctate epithelial keratitis; PEE punctate epithelial erosions; DES dry eye syndrome; MGD meibomian gland dysfunction; ATs artificial tears; PFAT's preservative free artificial tears; [place] nuclear sclerotic cataract; PSC posterior subcapsular cataract; ERM epi-retinal membrane; PVD posterior vitreous detachment; RD retinal detachment; DM diabetes mellitus; DR diabetic retinopathy; NPDR non-proliferative diabetic retinopathy; PDR proliferative diabetic retinopathy; CSME clinically significant macular edema; DME diabetic macular edema; dbh dot blot hemorrhages; CWS cotton wool spot; POAG primary open angle glaucoma; C/D cup-to-disc ratio; HVF humphrey visual field; GVF goldmann visual field; OCT optical coherence tomography; IOP intraocular pressure; BRVO Branch retinal vein occlusion; CRVO central retinal vein occlusion; CRAO central retinal artery occlusion; BRAO branch retinal artery occlusion; RT retinal tear; SB scleral buckle; PPV pars plana vitrectomy; VH Vitreous hemorrhage; PRP panretinal laser photocoagulation; IVK intravitreal kenalog; VMT vitreomacular traction; MH Macular hole;  NVD neovascularization of the disc; NVE neovascularization elsewhere; AREDS age related eye disease study; ARMD age related macular degeneration; POAG primary open angle glaucoma; EBMD epithelial/anterior basement membrane dystrophy; ACIOL anterior chamber intraocular lens; IOL intraocular lens; PCIOL posterior chamber intraocular lens; Phaco/IOL phacoemulsification with intraocular  lens placement; [place] photorefractive keratectomy; LASIK laser assisted in situ keratomileusis; HTN hypertension; DM diabetes mellitus; COPD chronic obstructive pulmonary disease

## 2017-10-01 ENCOUNTER — Encounter (INDEPENDENT_AMBULATORY_CARE_PROVIDER_SITE_OTHER): Payer: Self-pay | Admitting: Ophthalmology

## 2017-10-01 ENCOUNTER — Ambulatory Visit (INDEPENDENT_AMBULATORY_CARE_PROVIDER_SITE_OTHER): Payer: Medicare PPO | Admitting: Ophthalmology

## 2017-10-01 DIAGNOSIS — Z8669 Personal history of other diseases of the nervous system and sense organs: Secondary | ICD-10-CM

## 2017-10-01 DIAGNOSIS — H25813 Combined forms of age-related cataract, bilateral: Secondary | ICD-10-CM | POA: Diagnosis not present

## 2017-10-01 DIAGNOSIS — H35033 Hypertensive retinopathy, bilateral: Secondary | ICD-10-CM

## 2017-10-01 DIAGNOSIS — H35372 Puckering of macula, left eye: Secondary | ICD-10-CM

## 2017-10-01 DIAGNOSIS — I10 Essential (primary) hypertension: Secondary | ICD-10-CM

## 2017-10-01 DIAGNOSIS — H3581 Retinal edema: Secondary | ICD-10-CM | POA: Diagnosis not present

## 2017-10-04 ENCOUNTER — Encounter (INDEPENDENT_AMBULATORY_CARE_PROVIDER_SITE_OTHER): Payer: Self-pay | Admitting: Ophthalmology

## 2018-04-04 ENCOUNTER — Encounter (INDEPENDENT_AMBULATORY_CARE_PROVIDER_SITE_OTHER): Payer: Medicare PPO | Admitting: Ophthalmology

## 2018-04-04 NOTE — Progress Notes (Addendum)
CHIEF COMPLAINT Patient presents for Retina Follow Up   HISTORY OF PRESENT ILLNESS: Lori Schaefer is a 67 y.o. female who presents to the clinic today for:   HPI    Retina Follow Up    Patient presents with  Other.  In left eye.  This started months ago.  Severity is mild.  Duration of months.  Since onset it is gradually improving.  I, the attending physician,  performed the HPI with the patient and updated documentation appropriately.          Comments    67 y/o female pt here for 6 mo f/u for ERM OS.  VA improved OU since cat sx OU w/Dr Herbert Deaner back in August 2019.  Denies pain, flashes, floaters.  No gtts.       Last edited by Lori Caffey, MD on 04/05/2018  9:14 AM. (History)    pt states she has had cataract sx OU since last visit, she states one eye is set for distance and one is set for mid-range, pt states she cannot see up close to read anymore, she states she still has to wear glasses to feel comfortable, but overall she is happy with her vision  Referring physician: [name], MD [address] [place] [address]  HISTORICAL INFORMATION:   Selected notes from the MEDICAL RECORD NUMBER Referred by Dr. Monna Schaefer for surgical clearance OD LEE: 05.02.19 (KHerbert Deaner) [BCVA: OD: 20/30-2 OS: 20/25-1] Ocular Hx-Cataract OU, HTN Ret, Hx of RD (self-repaired), ERM OD, Salzmann's nodular dystrophy OD, drusen OS PMH-HTN,     CURRENT MEDICATIONS: No current outpatient medications on file. (Ophthalmic Drugs)   No current facility-administered medications for this visit.  (Ophthalmic Drugs)   Current Outpatient Medications (Other)  Medication Sig  . calcium carbonate (OSCAL) 1500 (600 Ca) MG TABS tablet Take by mouth 2 (two) times daily with a meal.  . CHLORTHALIDONE PO Take by mouth.  . diclofenac sodium (VOLTAREN) 1 % GEL Apply 4 g topically 4 (four) times daily.  . fluorouracil (EFUDEX) 5 % cream Apply topically 2 (two) times daily.  Marland Kitchen HYDROmorphone  (DILAUDID) 4 MG tablet Take one tablet by mouth every six to eight hours as needed for pain.  Marland Kitchen irbesartan (AVAPRO) 300 MG tablet Take 300 mg by mouth daily.  Marland Kitchen levothyroxine (SYNTHROID, LEVOTHROID) 75 MCG tablet Take 75 mcg by mouth daily before breakfast.  . Multiple Vitamin (MULTIVITAMIN) capsule Take 1 capsule by mouth daily.  . ondansetron (ZOFRAN) 4 MG tablet Take 1 tablet (4 mg total) by mouth every 8 (eight) hours as needed for nausea or vomiting.  Marland Kitchen telmisartan (MICARDIS) 40 MG tablet Take 40 mg by mouth daily.   No current facility-administered medications for this visit.  (Other)      REVIEW OF SYSTEMS: ROS    Positive for: Eyes   Negative for: Constitutional, Gastrointestinal, Neurological, Skin, Genitourinary, Musculoskeletal, HENT, Endocrine, Cardiovascular, Respiratory, Psychiatric, Allergic/Imm, Heme/Lymph   Last edited by [name], COA on 04/05/2018  8:38 AM. (History)       ALLERGIES No Known Allergies  PAST MEDICAL HISTORY Past Medical History:  Diagnosis Date  . Cataract   . Hypertension   . Thyroid disease    Past Surgical History:  Procedure Laterality Date  . FOOT SURGERY    . TUBAL LIGATION      FAMILY HISTORY Family History  Problem Relation Age of Onset  . Heart failure Mother     SOCIAL HISTORY Social History  Tobacco Use  . Smoking status: Never Smoker  . Smokeless tobacco: Never Used  Substance Use Topics  . Alcohol use: Yes    Alcohol/week: 1.0 standard drinks    Types: 1 Glasses of wine per week  . Drug use: Never         OPHTHALMIC EXAM:  Base Eye Exam    Visual Acuity (Snellen - Linear)      Right Left   Dist cc 20/30 - 20/20 -   Dist ph cc 20/25 +2    Correction:  Glasses       Tonometry (Tonopen, 8:44 AM)      Right Left   Pressure 9 9       Pupils      Dark Light Shape React APD   Right 4 2 Round Brisk None   Left 4 2 Round Brisk None       Visual Fields (Counting fingers)      Left Right     Full Full       Extraocular Movement      Right Left    Full, Ortho Full, Ortho       Neuro/Psych    Oriented x3:  Yes   Mood/Affect:  Normal       Dilation    Both eyes:  1.0% Mydriacyl, 2.5% Phenylephrine @ 8:44 AM        Slit Lamp and Fundus Exam    Slit Lamp Exam      Right Left   Lids/Lashes Dermatochalasis - upper lid Dermatochalasis - upper lid   Conjunctiva/Sclera White and quiet White and quiet   Cornea Arcus, salzmann's nodule ST quadrant, 2+ central Punctate epithelial erosions Arcus, 2+ central Punctate epithelial erosions, Well healed cataract wounds   Anterior Chamber Deep and quiet Deep and quiet   Iris Round and dilated, PPM, mild TID's at 0330 and 0930 Round and dilated   Lens PC IOL in good position, trace PCO PC IOL in good position, trace PCO   Vitreous Vitreous syneresis, Posterior vitreous detachment Vitreous syneresis       Fundus Exam      Right Left   Disc Pink and Sharp, mild temporal PPA Mild pallor   C/D Ratio 0.4 0.3   Macula Atrophy and pigment clumping inferior and temporal macula, Flat, Retinal pigment epithelial mottling, No heme or edema, Good foveal reflex Flat, Retinal pigment epithelial mottling and clumping, No heme or edema, Good foveal reflex   Vessels Vascular attenuation, mildly Tortuous Mild Vascular attenuation, mild Tortuousity, AV crossing changes   Periphery Attached, extensive atrophy and pigment clumping from 0400 to 1030 consistent with history of old RD - sparring the fovea Attached, cobblestoning at 0600          IMAGING AND PROCEDURES  Imaging and Procedures for @TODAY @  OCT, Retina - OU - Both Eyes       Right Eye Quality was good. Central Foveal Thickness: 359. Progression has been stable. Findings include normal foveal contour, no IRF, no SRF, outer retinal atrophy, inner retinal atrophy, epiretinal membrane (Mild ERM, inner and outer retinal atrophy inferiorly).   Left Eye Quality was good. Central Foveal  Thickness: 305. Progression has been stable. Findings include normal foveal contour, no IRF, no SRF (Trace ERM).   Notes *Images captured and stored on drive  Diagnosis / Impression:  OD: inferior inner and outer retinal atrophy consistent with Hx of inferior RD, mild ERM OS: NFP, No IRF/SRF, trace ERM  Clinical  management:  See below  Abbreviations: NFP - Normal foveal profile. CME - cystoid macular edema. PED - pigment epithelial detachment. IRF - intraretinal fluid. SRF - subretinal fluid. EZ - ellipsoid zone. ERM - epiretinal membrane. ORA - outer retinal atrophy. ORT - outer retinal tubulation. SRHM - subretinal hyper-reflective material         Color Fundus Photography Optos - OU - Both Eyes       Right Eye Progression has no prior data. Disc findings include normal observations. Macula : retinal pigment epithelium abnormalities. Vessels : normal observations, attenuated. Periphery : RPE abnormality (Peripheral pigmented CR scarring inferiorly from 1030 to 0400).   Left Eye Progression has no prior data. Disc findings include normal observations. Macula : normal observations. Vessels : normal observations. Periphery : normal observations.                 ASSESSMENT/PLAN:    ICD-10-CM   1. History of retinal detachment Z86.69 Color Fundus Photography Optos - OU - Both Eyes  2. Epiretinal membrane (ERM) of left eye H35.372   3. Retinal edema H35.81 OCT, Retina - OU - Both Eyes  4. Essential hypertension I10   5. Hypertensive retinopathy of both eyes H35.033   6. Pseudophakia of both eyes Z96.1     1. History or RD OD - self-limited inferior and temporal RD now large area of CR scarring  - scarring affecting temporal and inferior macular, but spares fovea - appears stable and BCVA remains 20/25+2 - Optos photos obtained today for comparison - monitor  2. ERM OS-  The natural history, anatomy, potential for loss of vision, and treatment options including  vitrectomy techniques and the complications of endophthalmitis, retinal detachment, vitreous hemorrhage, cataract progression and permanent vision loss discussed with the patient. - very mild ERM OS, no significant ERM OD - F/U 1 year  3. No retinal edema on exam or OCT  4,5. Hypertensive retinopathy OU - discussed importance of tight BP control - monitor  6. Pseudophakia OU  - s/p CE/IOL OU by Dr. Herbert Deaner, August 2019  - beautiful surgeries, doing well  - monitor   Ophthalmic Meds Ordered this visit:  No orders of the defined types were placed in this encounter.      Return in about 1 year (around 04/06/2019) for F/U ERM Dilated Exam, OCT.  There are no Patient Instructions on file for this visit.   Explained the diagnoses, plan, and follow up with the patient and they expressed understanding.  Patient expressed understanding of the importance of proper follow up care.   This document serves as a record of services personally performed by Gardiner Sleeper, MD, PhD. It was created on their behalf by Ernest Mallick, OA, an ophthalmic assistant. The creation of this record is the provider's dictation and/or activities during the visit.    Electronically signed by: Ernest Mallick, OA  11.11.19 1:15 PM    Gardiner Sleeper, M.D., Ph.D. Diseases & Surgery of the Retina and Vitreous [facility]   I have reviewed the above documentation for accuracy and completeness, and I agree with the above. Gardiner Sleeper, M.D., Ph.D. 04/06/18 1:19 PM      Abbreviations: M myopia (nearsighted); A astigmatism; H hyperopia (farsighted); P presbyopia; Mrx spectacle prescription;  CTL contact lenses; OD right eye; OS left eye; OU both eyes  XT exotropia; ET esotropia; PEK punctate epithelial keratitis; PEE punctate epithelial erosions; DES dry eye syndrome; MGD meibomian gland  dysfunction; ATs artificial tears; PFAT's preservative free artificial tears; [place] nuclear sclerotic  cataract; PSC posterior subcapsular cataract; ERM epi-retinal membrane; PVD posterior vitreous detachment; RD retinal detachment; DM diabetes mellitus; DR diabetic retinopathy; NPDR non-proliferative diabetic retinopathy; PDR proliferative diabetic retinopathy; CSME clinically significant macular edema; DME diabetic macular edema; dbh dot blot hemorrhages; CWS cotton wool spot; POAG primary open angle glaucoma; C/D cup-to-disc ratio; HVF humphrey visual field; GVF goldmann visual field; OCT optical coherence tomography; IOP intraocular pressure; BRVO Branch retinal vein occlusion; CRVO central retinal vein occlusion; CRAO central retinal artery occlusion; BRAO branch retinal artery occlusion; RT retinal tear; SB scleral buckle; PPV pars plana vitrectomy; VH Vitreous hemorrhage; PRP panretinal laser photocoagulation; IVK intravitreal kenalog; VMT vitreomacular traction; MH Macular hole;  NVD neovascularization of the disc; NVE neovascularization elsewhere; AREDS age related eye disease study; ARMD age related macular degeneration; POAG primary open angle glaucoma; EBMD epithelial/anterior basement membrane dystrophy; ACIOL anterior chamber intraocular lens; IOL intraocular lens; PCIOL posterior chamber intraocular lens; Phaco/IOL phacoemulsification with intraocular lens placement; [place] photorefractive keratectomy; LASIK laser assisted in situ keratomileusis; HTN hypertension; DM diabetes mellitus; COPD chronic obstructive pulmonary disease

## 2018-04-05 ENCOUNTER — Encounter (INDEPENDENT_AMBULATORY_CARE_PROVIDER_SITE_OTHER): Payer: Self-pay | Admitting: Ophthalmology

## 2018-04-05 ENCOUNTER — Ambulatory Visit (INDEPENDENT_AMBULATORY_CARE_PROVIDER_SITE_OTHER): Payer: Medicare PPO | Admitting: Ophthalmology

## 2018-04-05 DIAGNOSIS — H35372 Puckering of macula, left eye: Secondary | ICD-10-CM

## 2018-04-05 DIAGNOSIS — I10 Essential (primary) hypertension: Secondary | ICD-10-CM | POA: Diagnosis not present

## 2018-04-05 DIAGNOSIS — Z8669 Personal history of other diseases of the nervous system and sense organs: Secondary | ICD-10-CM

## 2018-04-05 DIAGNOSIS — H35033 Hypertensive retinopathy, bilateral: Secondary | ICD-10-CM

## 2018-04-05 DIAGNOSIS — Z961 Presence of intraocular lens: Secondary | ICD-10-CM

## 2018-04-05 DIAGNOSIS — H3581 Retinal edema: Secondary | ICD-10-CM
